# Patient Record
Sex: Male | Born: 1960 | Race: Black or African American | Hispanic: No | Smoking: Never smoker
Health system: Southern US, Community
[De-identification: ages and names within clinical notes are randomized; demographics above are authoritative.]

---

## 2011-12-10 ENCOUNTER — Emergency Department (HOSPITAL_COMMUNITY): Payer: No Typology Code available for payment source

## 2011-12-10 ENCOUNTER — Emergency Department (HOSPITAL_COMMUNITY)
Admission: EM | Admit: 2011-12-10 | Discharge: 2011-12-10 | Disposition: A | Discharge status: Home / Self Care | Payer: No Typology Code available for payment source | Attending: Emergency Medicine | Admitting: Emergency Medicine

## 2011-12-10 ENCOUNTER — Encounter (HOSPITAL_COMMUNITY): Payer: Self-pay | Admitting: Emergency Medicine

## 2011-12-10 DIAGNOSIS — S92309A Fracture of unspecified metatarsal bone(s), unspecified foot, initial encounter for closed fracture: Secondary | ICD-10-CM | POA: Insufficient documentation

## 2011-12-10 DIAGNOSIS — S92301A Fracture of unspecified metatarsal bone(s), right foot, initial encounter for closed fracture: Secondary | ICD-10-CM

## 2011-12-10 DIAGNOSIS — Y939 Activity, unspecified: Secondary | ICD-10-CM | POA: Insufficient documentation

## 2011-12-10 MED ORDER — OXYCODONE-ACETAMINOPHEN 5-325 MG PO TABS: ORAL_TABLET | ORAL | Status: DC

## 2011-12-10 MED ORDER — OXYCODONE-ACETAMINOPHEN 5-325 MG PO TABS
1.0000 | ORAL_TABLET | Freq: Once | ORAL | Status: AC
  Administered 2011-12-10: 1 via ORAL
  Filled 2011-12-10: qty 1

## 2011-12-10 NOTE — ED Provider Notes (Signed)
Medical screening examination/treatment/procedure(s) were conducted as a shared visit with non-physician practitioner(s) and myself.  I personally evaluated the patient during the encounter  Pt with normal distal sensation.  Difficulty moving toes but doubt compartment syndrome or other acute neurovascular complication.  Pt comfortable with minimal pain.  Celene Kras, MD 12/10/11 [phone]

## 2011-12-10 NOTE — ED Notes (Signed)
Ortho tech called for patient  
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
## 2011-12-10 NOTE — ED Notes (Signed)
Patient given discharge instructions, information, prescriptions, and diet order. Patient states that they adequately understand discharge information given and to return to ED if symptoms return or worsen.     
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
## 2012-01-03 ENCOUNTER — Ambulatory Visit
Admission: RE | Admit: 2012-01-03 | Discharge: 2012-01-03 | Disposition: A | Discharge status: Home / Self Care | Payer: No Typology Code available for payment source | Source: Ambulatory Visit | Attending: Orthopedic Surgery | Admitting: Orthopedic Surgery

## 2012-01-03 ENCOUNTER — Other Ambulatory Visit: Payer: Self-pay | Admitting: Orthopedic Surgery

## 2012-01-03 DIAGNOSIS — T148XXA Other injury of unspecified body region, initial encounter: Secondary | ICD-10-CM

## 2012-01-31 ENCOUNTER — Ambulatory Visit: Payer: No Typology Code available for payment source | Attending: Orthopedic Surgery | Admitting: Physical Therapy

## 2012-01-31 DIAGNOSIS — M25676 Stiffness of unspecified foot, not elsewhere classified: Secondary | ICD-10-CM | POA: Insufficient documentation

## 2012-01-31 DIAGNOSIS — IMO0001 Reserved for inherently not codable concepts without codable children: Secondary | ICD-10-CM | POA: Insufficient documentation

## 2012-01-31 DIAGNOSIS — M25579 Pain in unspecified ankle and joints of unspecified foot: Secondary | ICD-10-CM | POA: Insufficient documentation

## 2012-01-31 DIAGNOSIS — R262 Difficulty in walking, not elsewhere classified: Secondary | ICD-10-CM | POA: Insufficient documentation

## 2012-01-31 DIAGNOSIS — M25673 Stiffness of unspecified ankle, not elsewhere classified: Secondary | ICD-10-CM | POA: Insufficient documentation

## 2012-02-09 ENCOUNTER — Ambulatory Visit: Payer: No Typology Code available for payment source | Admitting: Rehabilitation

## 2012-02-16 ENCOUNTER — Encounter: Payer: Self-pay | Admitting: Physical Therapy

## 2012-02-21 ENCOUNTER — Encounter: Payer: Self-pay | Admitting: Physical Therapy

## 2012-02-23 ENCOUNTER — Ambulatory Visit: Payer: Self-pay | Attending: Orthopedic Surgery | Admitting: Physical Therapy

## 2012-02-23 DIAGNOSIS — R262 Difficulty in walking, not elsewhere classified: Secondary | ICD-10-CM | POA: Insufficient documentation

## 2012-02-23 DIAGNOSIS — M25579 Pain in unspecified ankle and joints of unspecified foot: Secondary | ICD-10-CM | POA: Insufficient documentation

## 2012-02-23 DIAGNOSIS — IMO0001 Reserved for inherently not codable concepts without codable children: Secondary | ICD-10-CM | POA: Insufficient documentation

## 2012-02-23 DIAGNOSIS — M25673 Stiffness of unspecified ankle, not elsewhere classified: Secondary | ICD-10-CM | POA: Insufficient documentation

## 2012-02-23 DIAGNOSIS — M25676 Stiffness of unspecified foot, not elsewhere classified: Secondary | ICD-10-CM | POA: Insufficient documentation

## 2012-02-28 ENCOUNTER — Ambulatory Visit: Payer: Self-pay | Admitting: Rehabilitation

## 2012-07-16 ENCOUNTER — Emergency Department (HOSPITAL_COMMUNITY)
Admission: EM | Admit: 2012-07-16 | Discharge: 2012-07-16 | Disposition: A | Discharge status: Home / Self Care | Payer: Self-pay | Attending: Emergency Medicine | Admitting: Emergency Medicine

## 2012-07-16 ENCOUNTER — Encounter (HOSPITAL_COMMUNITY): Payer: Self-pay | Admitting: Emergency Medicine

## 2012-07-16 DIAGNOSIS — L309 Dermatitis, unspecified: Secondary | ICD-10-CM

## 2012-07-16 DIAGNOSIS — L259 Unspecified contact dermatitis, unspecified cause: Secondary | ICD-10-CM | POA: Insufficient documentation

## 2012-07-16 DIAGNOSIS — L299 Pruritus, unspecified: Secondary | ICD-10-CM | POA: Insufficient documentation

## 2012-07-16 MED ORDER — PREDNISONE 10 MG PO TABS: ORAL_TABLET | ORAL | Status: DC

## 2012-07-16 MED ORDER — PREDNISONE 20 MG PO TABS
60.0000 mg | ORAL_TABLET | Freq: Once | ORAL | Status: AC
  Administered 2012-07-16: 60 mg via ORAL
  Filled 2012-07-16: qty 3

## 2012-07-16 MED ORDER — PREDNISONE 10 MG PO TABS: ORAL_TABLET | ORAL | Status: AC

## 2012-07-16 MED ORDER — TRIAMCINOLONE ACETONIDE 0.1 % EX CREA: TOPICAL_CREAM | Freq: Two times a day (BID) | CUTANEOUS | Status: AC

## 2012-07-16 MED ORDER — TRIAMCINOLONE ACETONIDE 0.1 % EX CREA
TOPICAL_CREAM | Freq: Two times a day (BID) | CUTANEOUS | Status: DC
  Administered 2012-07-16: 20:00:00 via TOPICAL
  Filled 2012-07-16: qty 15

## 2012-07-16 NOTE — ED Notes (Signed)
Pt c/o eczema flare up.  Reports hx of eczema and itching has increased and is now intolerable.

## 2012-07-16 NOTE — ED Provider Notes (Signed)
History    This chart was scribed for Jesus Crumble, PA-C working with Flint Melter, MD by Jiles Prows, ED scribe. This patient was seen in room WTR5/WTR5 and the patient's care was started at 7:42 PM.   CSN: [id number]  Arrival date & time 07/16/12  1804   Chief Complaint  Patient presents with  . Eczema    The history is provided by the patient and medical records. No language interpreter was used.   HPI Comments: Jesus Steele is a 52 y.o. male who presents to the Emergency Department with chronic eczema complaining of severe eczema on back, chest, arms, and the back of his legs that has been worsening for about 3 months.  Pt states that it is waking him up in his sleep and he has scratched holes in his skin due to itching.  Pt denies headache, diaphoresis, fever, chills, nausea, vomiting, diarrhea, weakness, cough, SOB and any other pain.   History reviewed. No pertinent past medical history.  History reviewed. No pertinent past surgical history.  No family history on file.  History  Substance Use Topics  . Smoking status: Never Smoker   . Smokeless tobacco: Never Used  . Alcohol Use: No      Review of Systems  Constitutional: Negative for fever and chills.  HENT: Negative for congestion, sore throat and rhinorrhea.   Respiratory: Negative for cough and shortness of breath.   Gastrointestinal: Negative for nausea, vomiting and diarrhea.  Skin: Positive for rash.  All other systems reviewed and are negative.    Allergies  Review of patient's allergies indicates no known allergies.  Home Medications  No current outpatient prescriptions on file.  Triage Vitals: BP 139/97  Pulse 79  Temp(Src) 98.2 F (36.8 C)  Resp 16  SpO2 99%  Physical Exam  Nursing note and vitals reviewed. Constitutional: He is oriented to person, place, and time. He appears well-developed and well-nourished. No distress.  HENT:  Head: Normocephalic and atraumatic.  Eyes: EOM are  normal.  Neck: Neck supple. No tracheal deviation present.  Cardiovascular: Normal rate.   Pulmonary/Chest: Effort normal. No respiratory distress.  Musculoskeletal: Normal range of motion.  Neurological: He is alert and oriented to person, place, and time.  Skin: Skin is warm and dry.  Hyperpigmented scaly rash over chest, back, abdomen, bilateral arms.  Excoriations noted as well.  Psychiatric: He has a normal mood and affect. His behavior is normal.    ED Course  Procedures (including critical care time) DIAGNOSTIC STUDIES: Oxygen Saturation is 99% on RA, normal by my interpretation.    COORDINATION OF CARE: 7:44 PM - Discussed ED treatment with pt at bedside including topical cream and oral steroids and pt agrees.     Labs Reviewed - No data to display No results found.   1. Eczema       MDM  Pt with extensive eczema to entire body. Will treat with kenalog cream and prednisoen short taper. Pt states unable to take benadryl for itching because swelled up once after taking. Pt does not have pcp. Home with follow up with [facility]  As needed.   Filed Vitals:   07/16/12 1938  BP: 139/97  Pulse: 79  Temp: 98.2 F (36.8 C)  Resp: 16    I personally performed the services described in this documentation, which was scribed in my presence. The recorded information has been reviewed and is accurate.    Lottie Mussel, PA-C 07/16/12 1954

## 2012-07-17 NOTE — ED Provider Notes (Signed)
Medical screening examination/treatment/procedure(s) were performed by non-physician practitioner and as supervising physician I was immediately available for consultation/collaboration.  Flint Melter, MD 07/17/12 [phone]

## 2018-07-30 ENCOUNTER — Emergency Department (HOSPITAL_COMMUNITY): Payer: Self-pay

## 2018-07-30 ENCOUNTER — Other Ambulatory Visit: Payer: Self-pay

## 2018-07-30 ENCOUNTER — Emergency Department (HOSPITAL_COMMUNITY)
Admission: EM | Admit: 2018-07-30 | Discharge: 2018-07-30 | Disposition: A | Discharge status: Home / Self Care | Payer: Self-pay | Attending: Emergency Medicine | Admitting: Emergency Medicine

## 2018-07-30 DIAGNOSIS — R0781 Pleurodynia: Secondary | ICD-10-CM

## 2018-07-30 DIAGNOSIS — M25511 Pain in right shoulder: Secondary | ICD-10-CM | POA: Insufficient documentation

## 2018-07-30 DIAGNOSIS — R0789 Other chest pain: Secondary | ICD-10-CM | POA: Insufficient documentation

## 2018-07-30 MED ORDER — OXYCODONE-ACETAMINOPHEN 5-325 MG PO TABS: 1.0000 | ORAL_TABLET | Freq: Four times a day (QID) | ORAL | 0 refills | Status: AC | PRN
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
## 2018-07-30 NOTE — Discharge Instructions (Signed)
Please read attached information. If you experience any new or worsening signs or symptoms please return to the emergency room for evaluation. Please follow-up with your primary care provider or specialist as discussed. Please use medication prescribed only as directed and discontinue taking if you have any concerning signs or symptoms.   °
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
## 2018-07-30 NOTE — ED Notes (Signed)
Patient Alert and oriented to baseline. Stable and ambulatory to baseline. Patient verbalized understanding of the discharge instructions.  Patient belongings were taken by the patient.   
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
